# Patient Record
Sex: Female | Born: 1995 | Race: White | Hispanic: No | Marital: Single | Smoking: Never smoker
Health system: Southern US, Community
[De-identification: ages and names within clinical notes are randomized; demographics above are authoritative.]

## PROBLEM LIST (undated history)

## (undated) HISTORY — PX: MYRINGOTOMY: SUR874

---
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
## 2018-03-22 ENCOUNTER — Emergency Department (INDEPENDENT_AMBULATORY_CARE_PROVIDER_SITE_OTHER)
Admission: EM | Admit: 2018-03-22 | Discharge: 2018-03-22 | Disposition: A | Discharge status: Home / Self Care | Payer: 59 | Source: Home / Self Care | Attending: Emergency Medicine | Admitting: Emergency Medicine

## 2018-03-22 ENCOUNTER — Other Ambulatory Visit: Payer: Self-pay

## 2018-03-22 ENCOUNTER — Encounter: Payer: Self-pay | Admitting: Emergency Medicine

## 2018-03-22 DIAGNOSIS — H66003 Acute suppurative otitis media without spontaneous rupture of ear drum, bilateral: Secondary | ICD-10-CM

## 2018-03-22 DIAGNOSIS — H6983 Other specified disorders of Eustachian tube, bilateral: Secondary | ICD-10-CM

## 2018-03-22 MED ORDER — PSEUDOEPHEDRINE-GUAIFENESIN ER 120-1200 MG PO TB12: 1.0000 | ORAL_TABLET | Freq: Two times a day (BID) | ORAL | 0 refills | Status: DC

## 2018-03-22 MED ORDER — FLUTICASONE PROPIONATE 50 MCG/ACT NA SUSP: NASAL | 0 refills | Status: DC

## 2018-03-22 MED ORDER — ACETAMINOPHEN 325 MG PO TABS
975.0000 mg | ORAL_TABLET | Freq: Once | ORAL | Status: AC
  Administered 2018-03-22: 975 mg via ORAL

## 2018-03-22 MED ORDER — SULFAMETHOXAZOLE-TRIMETHOPRIM 800-160 MG PO TABS: 1.0000 | ORAL_TABLET | Freq: Two times a day (BID) | ORAL | 0 refills | Status: AC

## 2018-03-22 NOTE — Discharge Instructions (Signed)
Take medications as prescribed. Please read attached instruction sheets on ear infections and eustachian tube dysfunction. Follow-up with your doctor if no better 7 days, sooner if worse or new symptoms

## 2018-03-22 NOTE — ED Triage Notes (Signed)
Patient has had a cold for about a week; last night she began to experience pain in both ears; no fever; no OTCs.

## 2018-03-22 NOTE — ED Provider Notes (Signed)
[facility]    CSN: [id number] Arrival date & time: 03/22/18  1244     History   Chief Complaint Chief Complaint  Patient presents with  . Otalgia  Here with mother  HPI Karen Santiago is a 22 y.o. female.   HPI Patient has had a cold for about a week; last night she began to experience pain in both ears; no fever; no OTCs. Denies ear drainage. Reviewed drug allergy history.  She is allergic to penicillin and mother states also allergic to Keflex, both of which have caused hives in the past. Denies any recent antibiotic usage or ear or sinus infections.  She used to have ear infections as a child but none since. Has low-grade fever, no documented fever.   Associated symptoms positive for discolored rhinorrhea and sinus congestion and pressure.  Ear pressure worse when she swallows it feels popping. No chest pain or shortness of breath.  No nausea or vomiting or cough. History reviewed. No pertinent past medical history.  There are no active problems to display for this patient.   Past Surgical History:  Procedure Laterality Date  . MYRINGOTOMY      OB History   None      Home Medications    Prior to Admission medications   Medication Sig Start Date End Date Taking? Authorizing Provider  Norethin Ace-Eth Estrad-FE (TAYTULLA) 1-20 MG-MCG(24) CAPS Take by mouth.   Yes [provider]  fluticasone Aleda Grana) 50 MCG/ACT nasal spray 1 or 2 sprays each nostril twice a day 03/22/18   Lajean Manes, MD  Pseudoephedrine-Guaifenesin [phone] MG TB12 Take 1 tablet by mouth 2 (two) times daily. As needed for sinus and ear congestion. (Caution: Do not take if blood pressure is elevated) 03/22/18   Lajean Manes, MD  sulfamethoxazole-trimethoprim (BACTRIM DS,SEPTRA DS) 800-160 MG tablet Take 1 tablet by mouth 2 (two) times daily for 7 days. 03/22/18 03/29/18  Lajean Manes, MD    Family History Family History  Problem Relation Age of Onset  . Cancer Other          Lung and bone  . Hypertension Mother   . Diabetes Father     Social History Social History   Tobacco Use  . Smoking status: Never Smoker  . Smokeless tobacco: Never Used  Substance Use Topics  . Alcohol use: No  . Drug use: No     Allergies   Amoxicillin; Penicillins; and Zithromax [azithromycin dihydrate]   Review of Systems Review of Systems  All other systems reviewed and are negative. Pertinent items noted in HPI and remainder of comprehensive ROS otherwise negative.    Physical Exam Triage Vital Signs ED Triage Vitals  Enc Vitals Group     BP 03/22/18 1400 123/72     Pulse Rate 03/22/18 1400 72     Resp 03/22/18 1400 18     Temp 03/22/18 1400 98.5 F (36.9 C)     Temp Source 03/22/18 1400 Oral     SpO2 03/22/18 1400 99 %     Weight 03/22/18 1401 237 lb (107.5 kg)     Height 03/22/18 1401 5\' 5"  (1.651 m)     Head Circumference --      Peak Flow --      Pain Score 03/22/18 1401 4     Pain Loc --      Pain Edu? --      Excl. in GC? --    No data found.  Updated Vital Signs  BP 123/72 (BP Location: Right Arm)   Pulse 72   Temp 98.5 F (36.9 C) (Oral)   Resp 18   Ht 5\' 5"  (1.651 m)   Wt 107.5 kg   LMP 03/22/2018 (Exact Date)   SpO2 99%   BMI 39.44 kg/m   Visual Acuity Right Eye Distance:   Left Eye Distance:   Bilateral Distance:    Right Eye Near:   Left Eye Near:    Bilateral Near:     Physical Exam  Constitutional: She is oriented to person, place, and time. She appears well-developed and well-nourished. No distress.  HENT:  Head: Normocephalic and atraumatic.  Right Ear: External ear and ear canal normal. Tympanic membrane is injected, erythematous and retracted. Tympanic membrane is not perforated.  Left Ear: External ear and ear canal normal. Tympanic membrane is injected, erythematous and retracted. Tympanic membrane is not perforated.  Nose: Mucosal edema and rhinorrhea present.  Mouth/Throat: Oropharynx is clear and moist.  No oral lesions.  Eyes: Conjunctivae are normal. No scleral icterus.  Neck: Neck supple.  Cardiovascular: Normal rate, regular rhythm and normal heart sounds.  Pulmonary/Chest: Effort normal and breath sounds normal.  Lymphadenopathy:    She has no cervical adenopathy.  Neurological: She is alert and oriented to person, place, and time.  Skin: Skin is warm and dry.  Nursing note and vitals reviewed.    UC Treatments / Results  Labs (all labs ordered are listed, but only abnormal results are displayed) Labs Reviewed - No data to display  EKG None  Radiology No results found.  Procedures Procedures (including critical care time)  Medications Ordered in UC Medications  acetaminophen (TYLENOL) tablet 975 mg (975 mg Oral Given 03/22/18 1404)    Initial Impression / Assessment and Plan / UC Course  I have reviewed the triage vital signs and the nursing notes.  Pertinent labs & imaging results that were available during my care of the patient were reviewed by me and considered in my medical decision making (see chart for details).      Final Clinical Impressions(s) / UC Diagnoses   Final diagnoses:  Non-recurrent acute suppurative otitis media of both ears without spontaneous rupture of tympanic membranes  Eustachian tube dysfunction, bilateral  Treatment options discussed with patient and mother.  Reviewed allergies to penicillin and cephalosporin and Zithromax. We will treat with Septra DS twice daily, Flonase, Mucinex D Other symptomatic care discussed.  Instruction sheets given.  AVS printed and given to patient and mother. Follow-up with your primary care doctor in 5-7 days if not improving, or sooner if symptoms become worse. Precautions discussed. Red flags discussed. Questions invited and answered. They voiced understanding and agreement.    Discharge Instructions     Take medications as prescribed. Please read attached instruction sheets on ear infections  and eustachian tube dysfunction. Follow-up with your doctor if no better 7 days, sooner if worse or new symptoms    ED Prescriptions    Medication Sig Dispense Auth. Provider   sulfamethoxazole-trimethoprim (BACTRIM DS,SEPTRA DS) 800-160 MG tablet Take 1 tablet by mouth 2 (two) times daily for 7 days. 14 tablet Lajean Manes, MD   Pseudoephedrine-Guaifenesin [phone] MG TB12 Take 1 tablet by mouth 2 (two) times daily. As needed for sinus and ear congestion. (Caution: Do not take if blood pressure is elevated) 20 each Lajean Manes, MD   fluticasone [employer]) 50 MCG/ACT nasal spray 1 or 2 sprays each nostril twice a day 16 g Lajean Manes, MD  Lajean Manes, MD 03/22/18 [phone]

## 2018-04-11 ENCOUNTER — Other Ambulatory Visit: Payer: Self-pay | Admitting: Emergency Medicine

## 2019-02-14 ENCOUNTER — Encounter: Payer: Self-pay | Admitting: Emergency Medicine

## 2019-02-14 ENCOUNTER — Other Ambulatory Visit: Payer: Self-pay

## 2019-02-14 ENCOUNTER — Emergency Department (INDEPENDENT_AMBULATORY_CARE_PROVIDER_SITE_OTHER)
Admission: EM | Admit: 2019-02-14 | Discharge: 2019-02-14 | Disposition: A | Discharge status: Home / Self Care | Payer: 59 | Source: Home / Self Care

## 2019-02-14 DIAGNOSIS — B9689 Other specified bacterial agents as the cause of diseases classified elsewhere: Secondary | ICD-10-CM

## 2019-02-14 DIAGNOSIS — J019 Acute sinusitis, unspecified: Secondary | ICD-10-CM | POA: Diagnosis not present

## 2019-02-14 MED ORDER — CEFDINIR 300 MG PO CAPS: 300.0000 mg | ORAL_CAPSULE | Freq: Two times a day (BID) | ORAL | 0 refills | Status: AC

## 2019-02-14 NOTE — Discharge Instructions (Signed)
°  You may take 500mg  acetaminophen every 4-6 hours for pain and fever.  Please take antibiotics as prescribed and be sure to complete entire course even if you start to feel better to ensure infection does not come back. Stop taking this medication immediately if you develop a rash, throat swelling, and/or trouble breathing and seek medical attention.  It does appear you have had this medication as recent as February of 2019 so you should do well.    Be sure to drink at least eight 8oz glasses of water to stay well hydrated and get at least 8 hours of sleep at night, preferably more while sick.

## 2019-02-14 NOTE — ED Provider Notes (Signed)
[facility]    CSN: [id number] Arrival date & time: 02/14/19  1526      History   Chief Complaint Chief Complaint  Patient presents with  . Nasal Congestion  . Facial Pain  . Sore Throat    HPI Karen Santiago is a 23 y.o. female.   HPI Karen Santiago is a 23 y.o. female [redacted] weeks pregnant with EDD 05/19/2019.  Pt c/o 1 week of nasal congestion, post-nasal drip and cough. Sudden worsening of sinus pain and pressure the last 2 days.  Pain is worse under her eyes.  She is not sure what she is allowed to take during pregnancy, no medication tried PTA. Hx of sinus infections in the past, symptoms feel similar. Denies fever, chills, n/v/d.    History reviewed. No pertinent past medical history.  There are no active problems to display for this patient.   Past Surgical History:  Procedure Laterality Date  . MYRINGOTOMY      OB History    Gravida  1   Para      Term      Preterm      AB      Living        SAB      TAB      Ectopic      Multiple      Live Births               Home Medications    Prior to Admission medications   Medication Sig Start Date End Date Taking? Authorizing Provider  cefdinir (OMNICEF) 300 MG capsule Take 1 capsule (300 mg total) by mouth 2 (two) times daily for 10 days. 02/14/19 02/24/19  Lurene ShadowPhelps, Priyana Mccarey O, PA-C  fluticasone (FLONASE) 50 MCG/ACT nasal spray 1 OR 2 SPRAYS EACH NOSTRIL TWICE A DAY 04/13/18   Lajean ManesMassey, David, MD  Norethin Ace-Eth Estrad-FE (TAYTULLA) 1-20 MG-MCG(24) CAPS Take by mouth.    [provider]  Pseudoephedrine-Guaifenesin [phone] MG TB12 Take 1 tablet by mouth 2 (two) times daily. As needed for sinus and ear congestion. (Caution: Do not take if blood pressure is elevated) 03/22/18   Lajean ManesMassey, David, MD    Family History Family History  Problem Relation Age of Onset  . Cancer Other        Lung and bone  . Hypertension Mother   . Diabetes Father     Social History Social History    Tobacco Use  . Smoking status: Never Smoker  . Smokeless tobacco: Never Used  Substance Use Topics  . Alcohol use: No  . Drug use: No     Allergies   Amoxicillin, Penicillins, and Zithromax [azithromycin dihydrate]   Review of Systems Review of Systems  Constitutional: Negative for chills and fever.  HENT: Positive for congestion, postnasal drip, sinus pressure and sinus pain. Negative for ear pain, sore throat, trouble swallowing and voice change.   Respiratory: Negative for cough and shortness of breath.   Cardiovascular: Negative for chest pain and palpitations.  Gastrointestinal: Negative for abdominal pain, diarrhea, nausea and vomiting.  Musculoskeletal: Negative for arthralgias, back pain and myalgias.  Skin: Negative for rash.  Neurological: Positive for headaches (frontal). Negative for dizziness and light-headedness.     Physical Exam Triage Vital Signs ED Triage Vitals  Enc Vitals Group     BP 02/14/19 1553 (!) 149/74     Pulse Rate 02/14/19 1553 94     Resp 02/14/19 1553 18  Temp 02/14/19 1553 98.4 F (36.9 C)     Temp Source 02/14/19 1553 Oral     SpO2 02/14/19 1553 98 %     Weight 02/14/19 1554 250 lb (113.4 kg)     Height 02/14/19 1554 5\' 5"  (1.651 m)     Head Circumference --      Peak Flow --      Pain Score 02/14/19 1554 2     Pain Loc --      Pain Edu? --      Excl. in GC? --    No data found.  Updated Vital Signs BP 115/77 (BP Location: Right Arm)   Pulse 94   Temp 98.4 F (36.9 C) (Oral)   Resp 18   Ht 5\' 5"  (1.651 m)   Wt 250 lb (113.4 kg)   LMP  (LMP Unknown) Comment: edc 05/19/19  SpO2 98%   BMI 41.60 kg/m   Visual Acuity Right Eye Distance:   Left Eye Distance:   Bilateral Distance:    Right Eye Near:   Left Eye Near:    Bilateral Near:     Physical Exam Vitals signs and nursing note reviewed.  Constitutional:      Appearance: She is well-developed.  HENT:     Head: Normocephalic and atraumatic.     Right Ear:  Tympanic membrane normal.     Left Ear: Tympanic membrane normal.     Nose: Mucosal edema present.     Right Sinus: Maxillary sinus tenderness and frontal sinus tenderness present.     Left Sinus: Maxillary sinus tenderness and frontal sinus tenderness present.     Mouth/Throat:     Lips: Pink.     Mouth: Mucous membranes are moist.     Pharynx: Oropharynx is clear. Uvula midline. No posterior oropharyngeal erythema.  Neck:     Musculoskeletal: Normal range of motion and neck supple.  Cardiovascular:     Rate and Rhythm: Normal rate and regular rhythm.  Pulmonary:     Effort: Pulmonary effort is normal. No respiratory distress.     Breath sounds: Normal breath sounds.  Musculoskeletal: Normal range of motion.  Lymphadenopathy:     Cervical: No cervical adenopathy.  Skin:    General: Skin is warm and dry.  Neurological:     Mental Status: She is alert and oriented to person, place, and time.  Psychiatric:        Behavior: Behavior normal.      UC Treatments / Results  Labs (all labs ordered are listed, but only abnormal results are displayed) Labs Reviewed - No data to display  EKG   Radiology No results found.  Procedures Procedures (including critical care time)  Medications Ordered in UC Medications - No data to display  Initial Impression / Assessment and Plan / UC Course  I have reviewed the triage vital signs and the nursing notes.  Pertinent labs & imaging results that were available during my care of the patient were reviewed by me and considered in my medical decision making (see chart for details).     Hx and exam c/w sinusitis  Given worsening pain/tenderness will cover for bacterial infection Pt has unknown allergy to PCN but per medical records, pt did have cefdinir last year w/o complication. Will start on cefdinir F/u with PCP  AVS provided.  Final Clinical Impressions(s) / UC Diagnoses   Final diagnoses:  Acute bacterial rhinosinusitis      Discharge Instructions  You may take 500mg  acetaminophen every 4-6 hours for pain and fever.  Please take antibiotics as prescribed and be sure to complete entire course even if you start to feel better to ensure infection does not come back. Stop taking this medication immediately if you develop a rash, throat swelling, and/or trouble breathing and seek medical attention.  It does appear you have had this medication as recent as February of 2019 so you should do well.    Be sure to drink at least eight 8oz glasses of water to stay well hydrated and get at least 8 hours of sleep at night, preferably more while sick.      ED Prescriptions    Medication Sig Dispense Auth. Provider   cefdinir (OMNICEF) 300 MG capsule Take 1 capsule (300 mg total) by mouth 2 (two) times daily for 10 days. 20 capsule Noe Gens, PA-C     Controlled Substance Prescriptions Secor Controlled Substance Registry consulted? Not Applicable   Tyrell Antonio 02/15/19 1558

## 2019-02-14 NOTE — ED Triage Notes (Signed)
Patient is [redacted] weeks pregnant with [facility] 83/2/91; uncomplicated. She has been having nasal congestion, pain to touch under her eyes and sore throat for past 2 days; denies fever. She has not taken any OTCs. She has not travelled past 4 weeks.

## 2019-12-08 ENCOUNTER — Other Ambulatory Visit: Payer: Self-pay

## 2019-12-08 ENCOUNTER — Emergency Department (INDEPENDENT_AMBULATORY_CARE_PROVIDER_SITE_OTHER)
Admission: EM | Admit: 2019-12-08 | Discharge: 2019-12-08 | Disposition: A | Discharge status: Home / Self Care | Payer: 59 | Source: Home / Self Care | Attending: Family Medicine | Admitting: Family Medicine

## 2019-12-08 ENCOUNTER — Emergency Department (INDEPENDENT_AMBULATORY_CARE_PROVIDER_SITE_OTHER): Payer: 59

## 2019-12-08 DIAGNOSIS — R0981 Nasal congestion: Secondary | ICD-10-CM | POA: Diagnosis not present

## 2019-12-08 DIAGNOSIS — J302 Other seasonal allergic rhinitis: Secondary | ICD-10-CM

## 2019-12-08 DIAGNOSIS — J069 Acute upper respiratory infection, unspecified: Secondary | ICD-10-CM

## 2019-12-08 MED ORDER — PREDNISONE 20 MG PO TABS: ORAL_TABLET | ORAL | 0 refills | Status: AC

## 2019-12-08 MED ORDER — CEFDINIR 300 MG PO CAPS: 300.0000 mg | ORAL_CAPSULE | Freq: Two times a day (BID) | ORAL | 0 refills | Status: AC

## 2019-12-08 NOTE — Discharge Instructions (Addendum)
Take plain guaifenesin (1200mg  extended release tabs such as Mucinex) twice daily, with plenty of water, for cough and congestion.  Get adequate rest.   May use Afrin nasal spray (or generic oxymetazoline) each morning for about 5 days and then discontinue.  Also recommend using saline nasal spray several times daily and saline nasal irrigation (AYR is a common brand).  Use Flonase nasal spray each morning after using Afrin nasal spray and saline nasal irrigation. Try warm salt water gargles for sore throat.  Stop all antihistamines for now, and other non-prescription cough/cold preparations. Begin Omnicef if not improving about one week or if persistent fever develops

## 2019-12-08 NOTE — ED Triage Notes (Signed)
Pt went to another UC 6/19 for bilateral ear pain. Was rx'd an antibiotic. Finished course on 6/29. Now starting to have facial and jaw pain/pressure. Taking mucinex and zyrtec prn.

## 2019-12-08 NOTE — ED Provider Notes (Signed)
[facility]    CSN: [id number] Arrival date & time: 12/08/19  1937      History   Chief Complaint Chief Complaint  Patient presents with  . Ear Problem    HPI Karen Santiago is a 24 y.o. female.   Patient reports that she developed bilateral ear pain 11 days ago and was treated for otitis media with Keflex at another urgent care.  During the past 3 days she has developed mild sore throat, fatigue, headache, and increased sinus congestion.  Today she developed a mild cough, and is having facial and jaw pain/pressure. She has a history of seasonal rhinitis for which she takes Zyrtec.  The history is provided by the patient.    History reviewed. No pertinent past medical history.  There are no problems to display for this patient.   Past Surgical History:  Procedure Laterality Date  . MYRINGOTOMY      OB History    Gravida  1   Para      Term      Preterm      AB      Living        SAB      TAB      Ectopic      Multiple      Live Births               Home Medications    Prior to Admission medications   Medication Sig Start Date End Date Taking? Authorizing Provider  cefdinir (OMNICEF) 300 MG capsule Take 1 capsule (300 mg total) by mouth 2 (two) times daily. 12/08/19   Lattie Haw, MD  fluticasone (FLONASE) 50 MCG/ACT nasal spray 1 OR 2 SPRAYS EACH NOSTRIL TWICE A DAY 04/13/18   Lajean Manes, MD  predniSONE (DELTASONE) 20 MG tablet Take one tab by mouth twice daily for 4 days, then one daily for 3 days. Take with food. 12/08/19   Lattie Haw, MD    Family History Family History  Problem Relation Age of Onset  . Cancer Other        Lung and bone  . Hypertension Mother   . Diabetes Father     Social History Social History   Tobacco Use  . Smoking status: Never Smoker  . Smokeless tobacco: Never Used  Vaping Use  . Vaping Use: Never used  Substance Use Topics  . Alcohol use: No  . Drug use: No     Allergies    Amoxicillin, Penicillins, and Zithromax [azithromycin dihydrate]   Review of Systems Review of Systems + sore throat + cough No pleuritic pain No wheezing + nasal congestion + post-nasal drainage + sinus pain/pressure No itchy/red eyes No earache No hemoptysis No SOB No fever/chills No nausea No vomiting No abdominal pain No diarrhea No urinary symptoms No skin rash + fatigue No myalgias + headache   Physical Exam Triage Vital Signs ED Triage Vitals  Enc Vitals Group     BP 12/08/19 1903 128/84     Pulse Rate 12/08/19 1903 86     Resp 12/08/19 1903 18     Temp 12/08/19 1903 98.1 F (36.7 C)     Temp Source 12/08/19 1903 Oral     SpO2 12/08/19 1903 98 %     Weight --      Height --      Head Circumference --      Peak Flow --      Pain  Score 12/08/19 1906 6     Pain Loc --      Pain Edu? --      Excl. in GC? --    No data found.  Updated Vital Signs BP 128/84 (BP Location: Right Arm)   Pulse 86   Temp 98.1 F (36.7 C) (Oral)   Resp 18   LMP  (LMP Unknown)   SpO2 98%   Breastfeeding Unknown Comment: Breastfeeding  Visual Acuity Right Eye Distance:   Left Eye Distance:   Bilateral Distance:    Right Eye Near:   Left Eye Near:    Bilateral Near:     Physical Exam Nursing notes and Vital Signs reviewed. Appearance:  Patient appears stated age, and in no acute distress Eyes:  Pupils are equal, round, and reactive to light and accomodation.  Extraocular movement is intact.  Conjunctivae are not inflamed  Ears:  Canals normal.  Tympanic membranes normal.  Nose:  Cngested turbinates.  Maxillary sinus tenderness is present.  Pharynx:  Normal Neck:  Supple.  No adenopathy present. Lungs:  Clear to auscultation.  Breath sounds are equal.  Moving air well. Heart:  Regular rate and rhythm without murmurs, rubs, or gallops.  Abdomen:  Nontender without masses or hepatosplenomegaly.  Bowel sounds are present.  No CVA or flank tenderness.  Extremities:   No edema.  Skin:  No rash present.   UC Treatments / Results  Labs (all labs ordered are listed, but only abnormal results are displayed) Labs Reviewed - No data to display  EKG   Radiology DG Sinuses Complete  Result Date: 12/08/2019 CLINICAL DATA:  Sinus congestion, tenderness EXAM: PARANASAL SINUSES - COMPLETE 3 + VIEW COMPARISON:  None. FINDINGS: The paranasal sinus are aerated. There is no evidence of sinus opacification air-fluid levels or mucosal thickening. No significant bone abnormalities are seen. IMPRESSION: Negative. Electronically Signed   By: Charlett Nose M.D.   On: 12/08/2019 19:50    Procedures Procedures (including critical care time)  Medications Ordered in UC Medications - No data to display  Initial Impression / Assessment and Plan / UC Course  I have reviewed the triage vital signs and the nursing notes.  Pertinent labs & imaging results that were available during my care of the patient were reviewed by me and considered in my medical decision making (see chart for details).    There is no evidence of bacterial infection today.   Begin prednisone burst/taper.   Final Clinical Impressions(s) / UC Diagnoses   Final diagnoses:  Viral URI with cough  Seasonal allergic rhinitis, unspecified trigger     Discharge Instructions     Take plain guaifenesin (1200mg  extended release tabs such as Mucinex) twice daily, with plenty of water, for cough and congestion.  Get adequate rest.   May use Afrin nasal spray (or generic oxymetazoline) each morning for about 5 days and then discontinue.  Also recommend using saline nasal spray several times daily and saline nasal irrigation (AYR is a common brand).  Use Flonase nasal spray each morning after using Afrin nasal spray and saline nasal irrigation. Try warm salt water gargles for sore throat.  Stop all antihistamines for now, and other non-prescription cough/cold preparations. Begin Omnicef if not improving about  one week or if persistent fever develops (Given a prescription to hold, with an expiration date)        ED Prescriptions    Medication Sig Dispense Auth. Provider   predniSONE (DELTASONE) 20 MG tablet Take  one tab by mouth twice daily for 4 days, then one daily for 3 days. Take with food. 11 tablet Lattie Haw, MD   cefdinir (OMNICEF) 300 MG capsule Take 1 capsule (300 mg total) by mouth 2 (two) times daily. 14 capsule Lattie Haw, MD        Lattie Haw, MD 12/11/19 [phone]

## 2021-09-03 IMAGING — DX DG SINUSES COMPLETE 3+V
3 series · 3 of 3 positions shown · non-contrast
Comparison: None.

CLINICAL DATA: Sinus congestion, tenderness

EXAM:
PARANASAL SINUSES - COMPLETE 3 + VIEW

[pns waters]
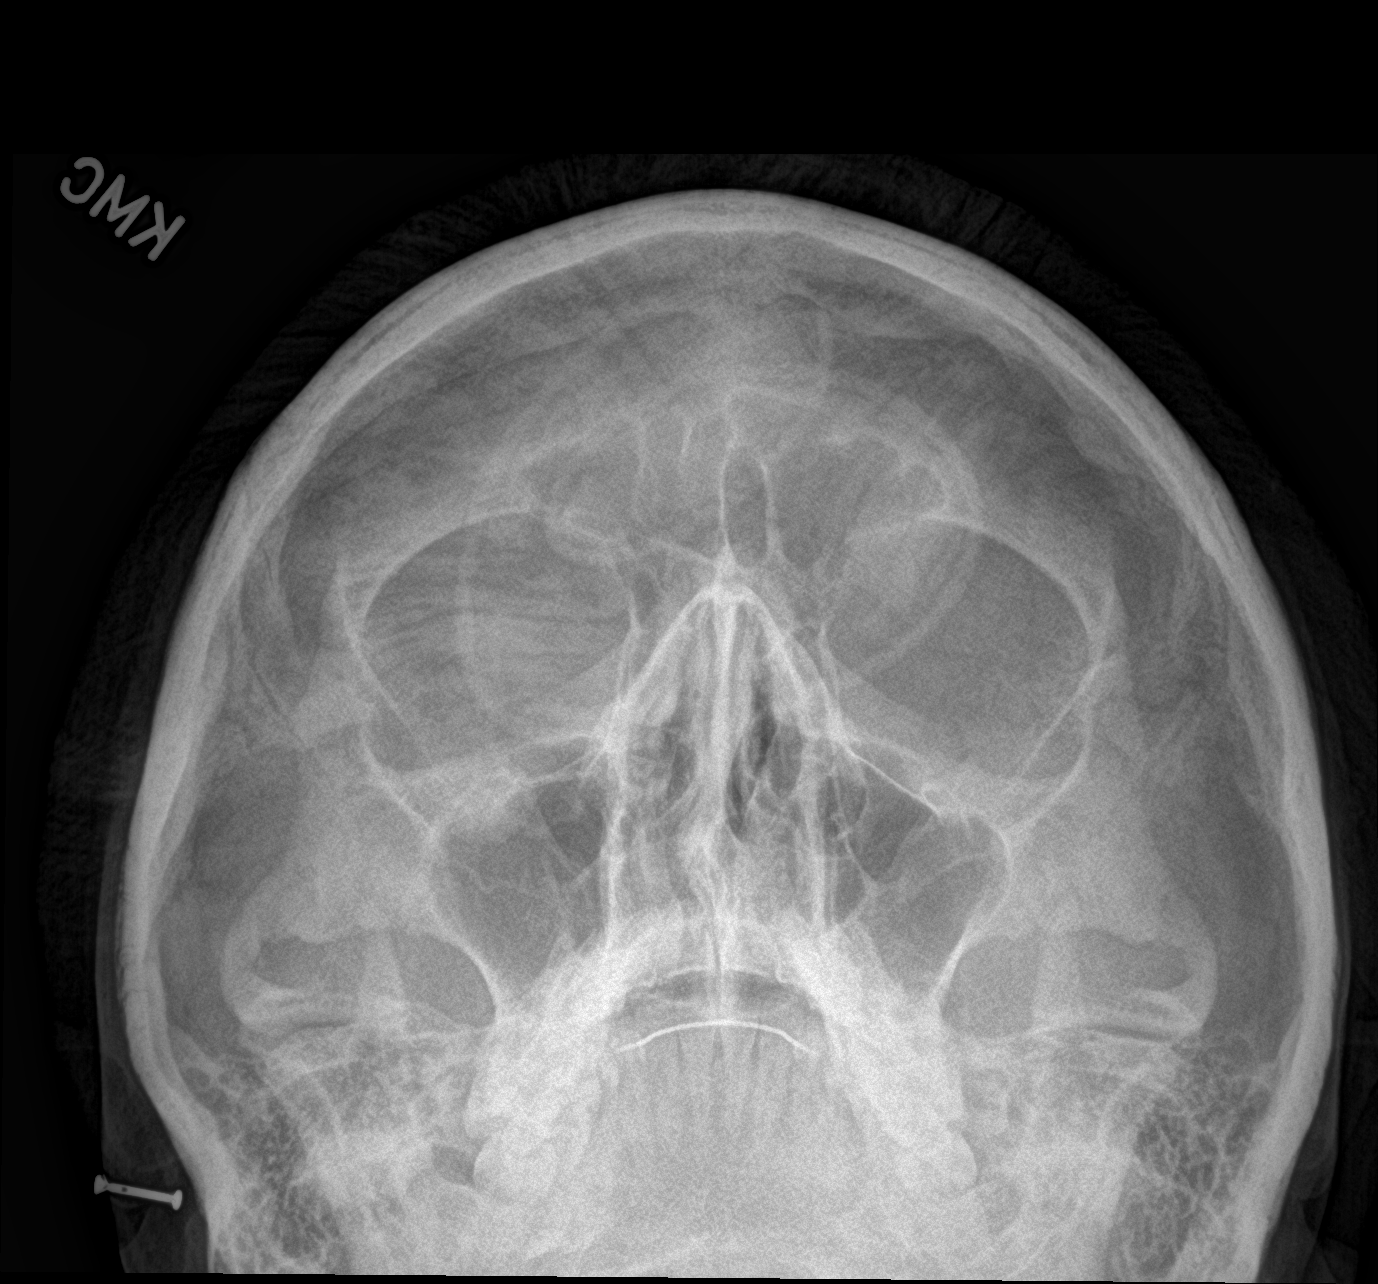

[[person_name]]
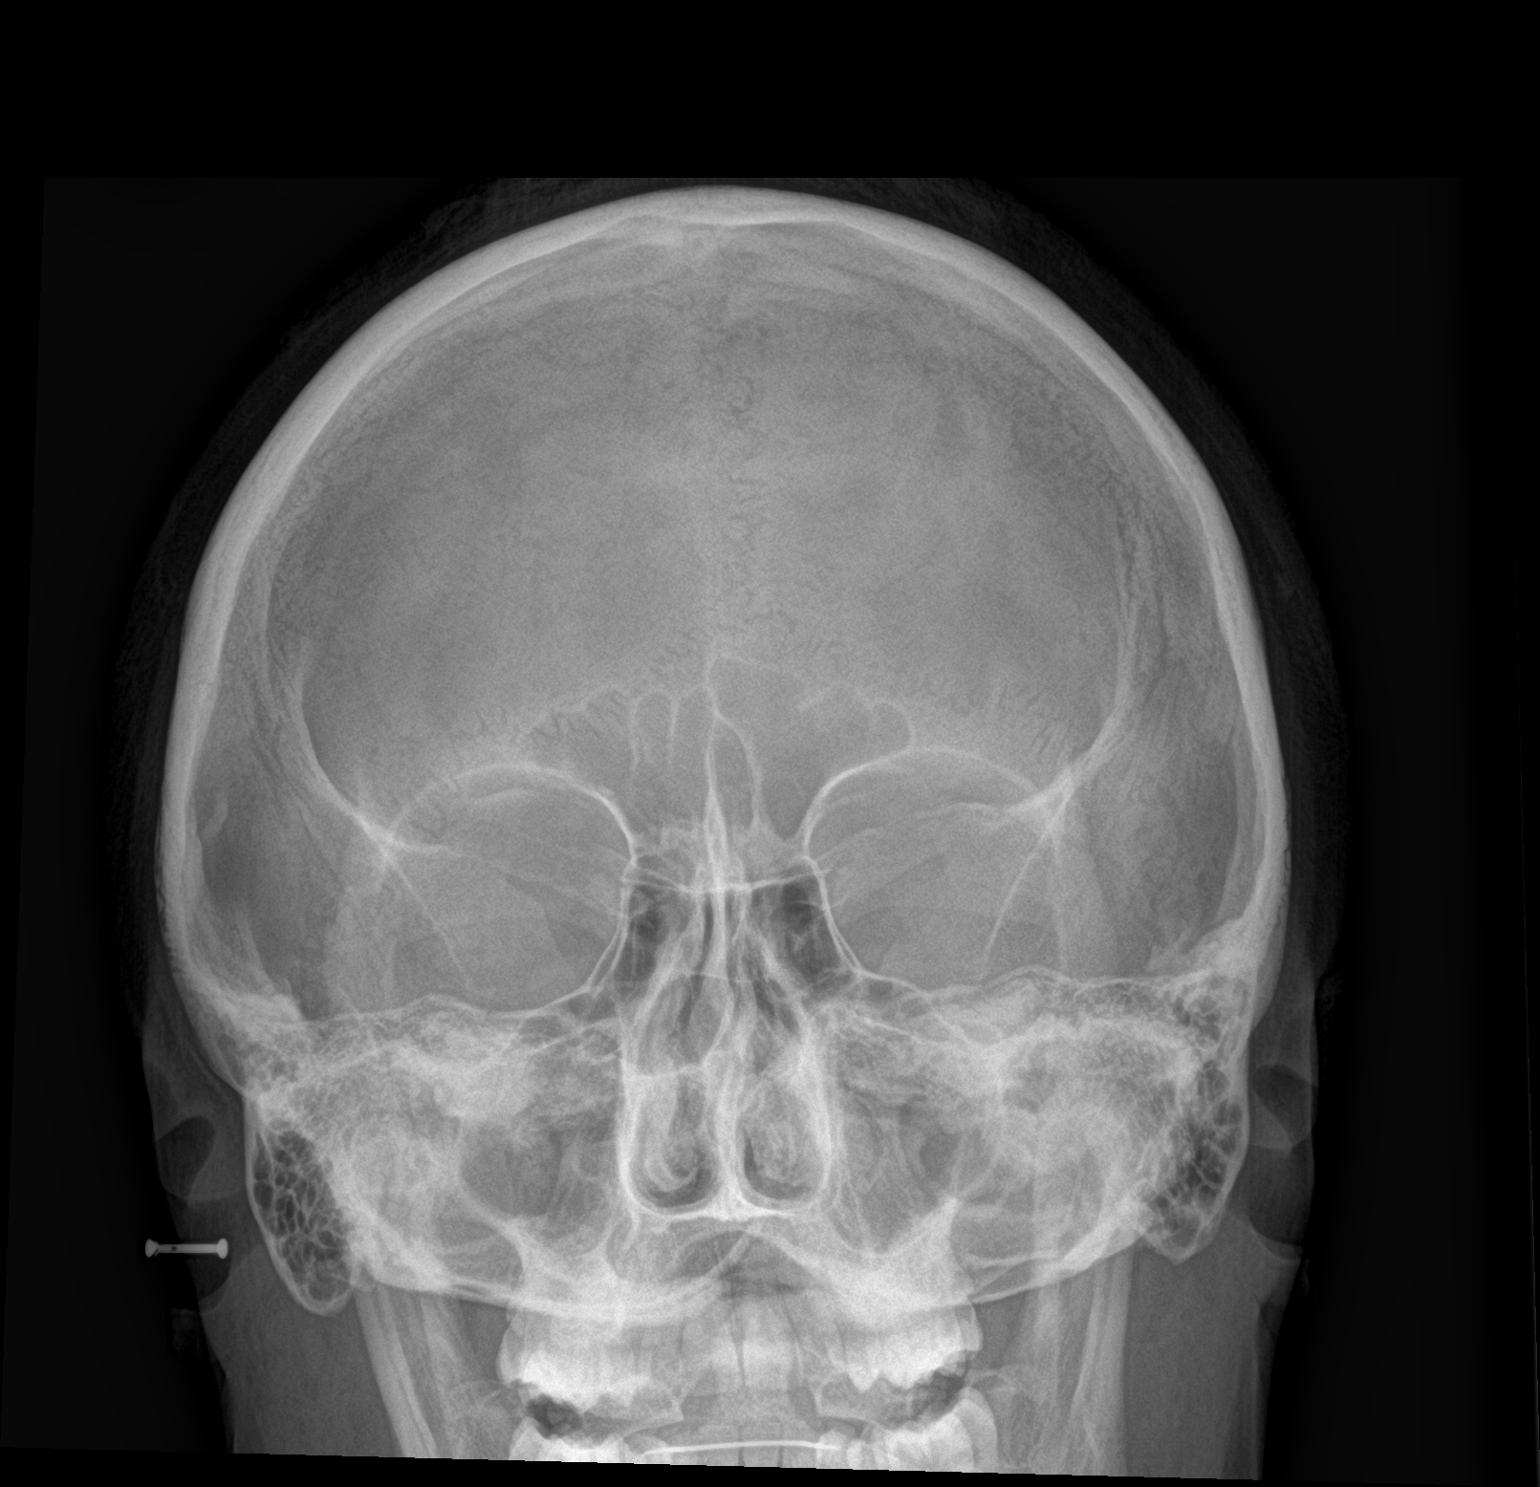

[pns lat]
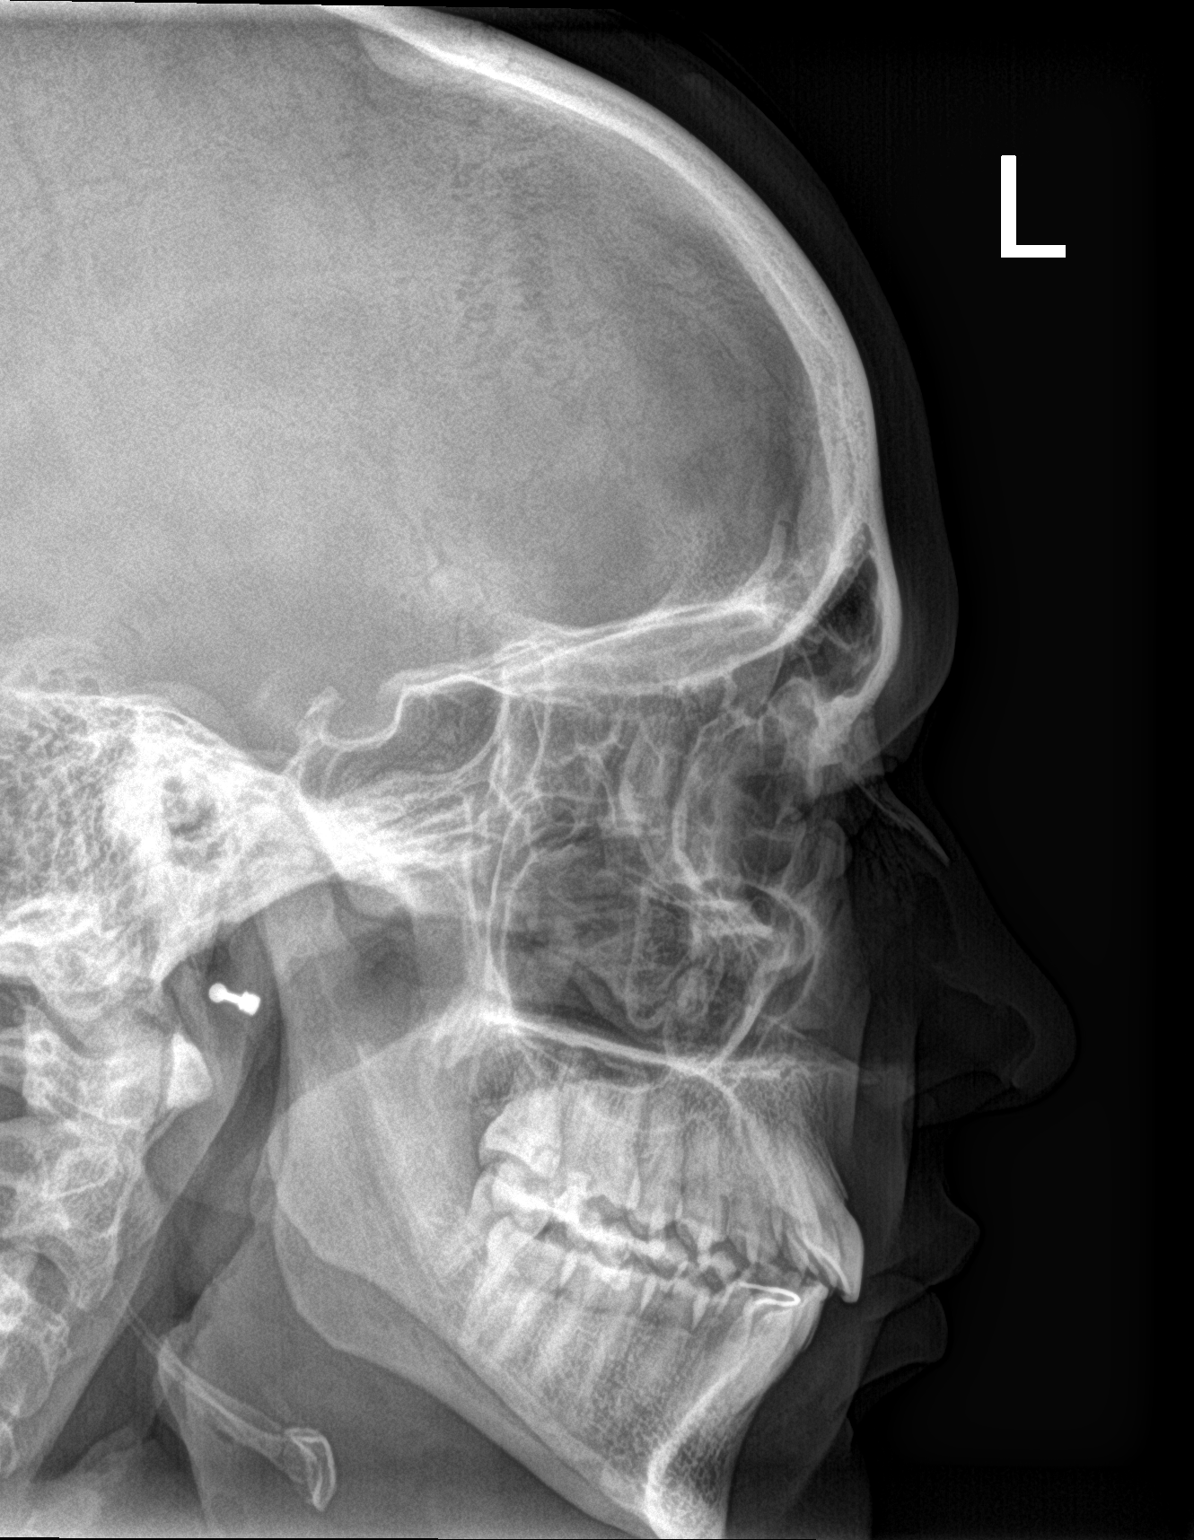

[3 of 3 positions shown; findings below may reference images not displayed]

FINDINGS: The paranasal sinus are aerated. There is no evidence of sinus
opacification air-fluid levels or mucosal thickening. No significant
bone abnormalities are seen.
IMPRESSION: Negative.
